# Patient Record
Sex: Male | Born: 1965 | Race: White | Hispanic: No | Marital: Married | Smoking: Never smoker
Health system: Southern US, Community
[De-identification: ages and names within clinical notes are randomized; demographics above are authoritative.]

## PROBLEM LIST (undated history)

## (undated) DIAGNOSIS — K219 Gastro-esophageal reflux disease without esophagitis: Secondary | ICD-10-CM

## (undated) HISTORY — PX: KNEE SURGERY: SHX244

---

## 2004-08-07 ENCOUNTER — Observation Stay: Payer: Self-pay | Admitting: General Surgery

## 2005-02-01 ENCOUNTER — Ambulatory Visit: Payer: Self-pay | Admitting: General Surgery

## 2006-11-04 IMAGING — CR DG CHEST 1V PORT
1 series · 1 of 1 positions shown · non-contrast
Comparison: none

REASON FOR EXAM: The patient showed evidence of pneumothorax on CT.
COMMENTS:

[view not recorded]
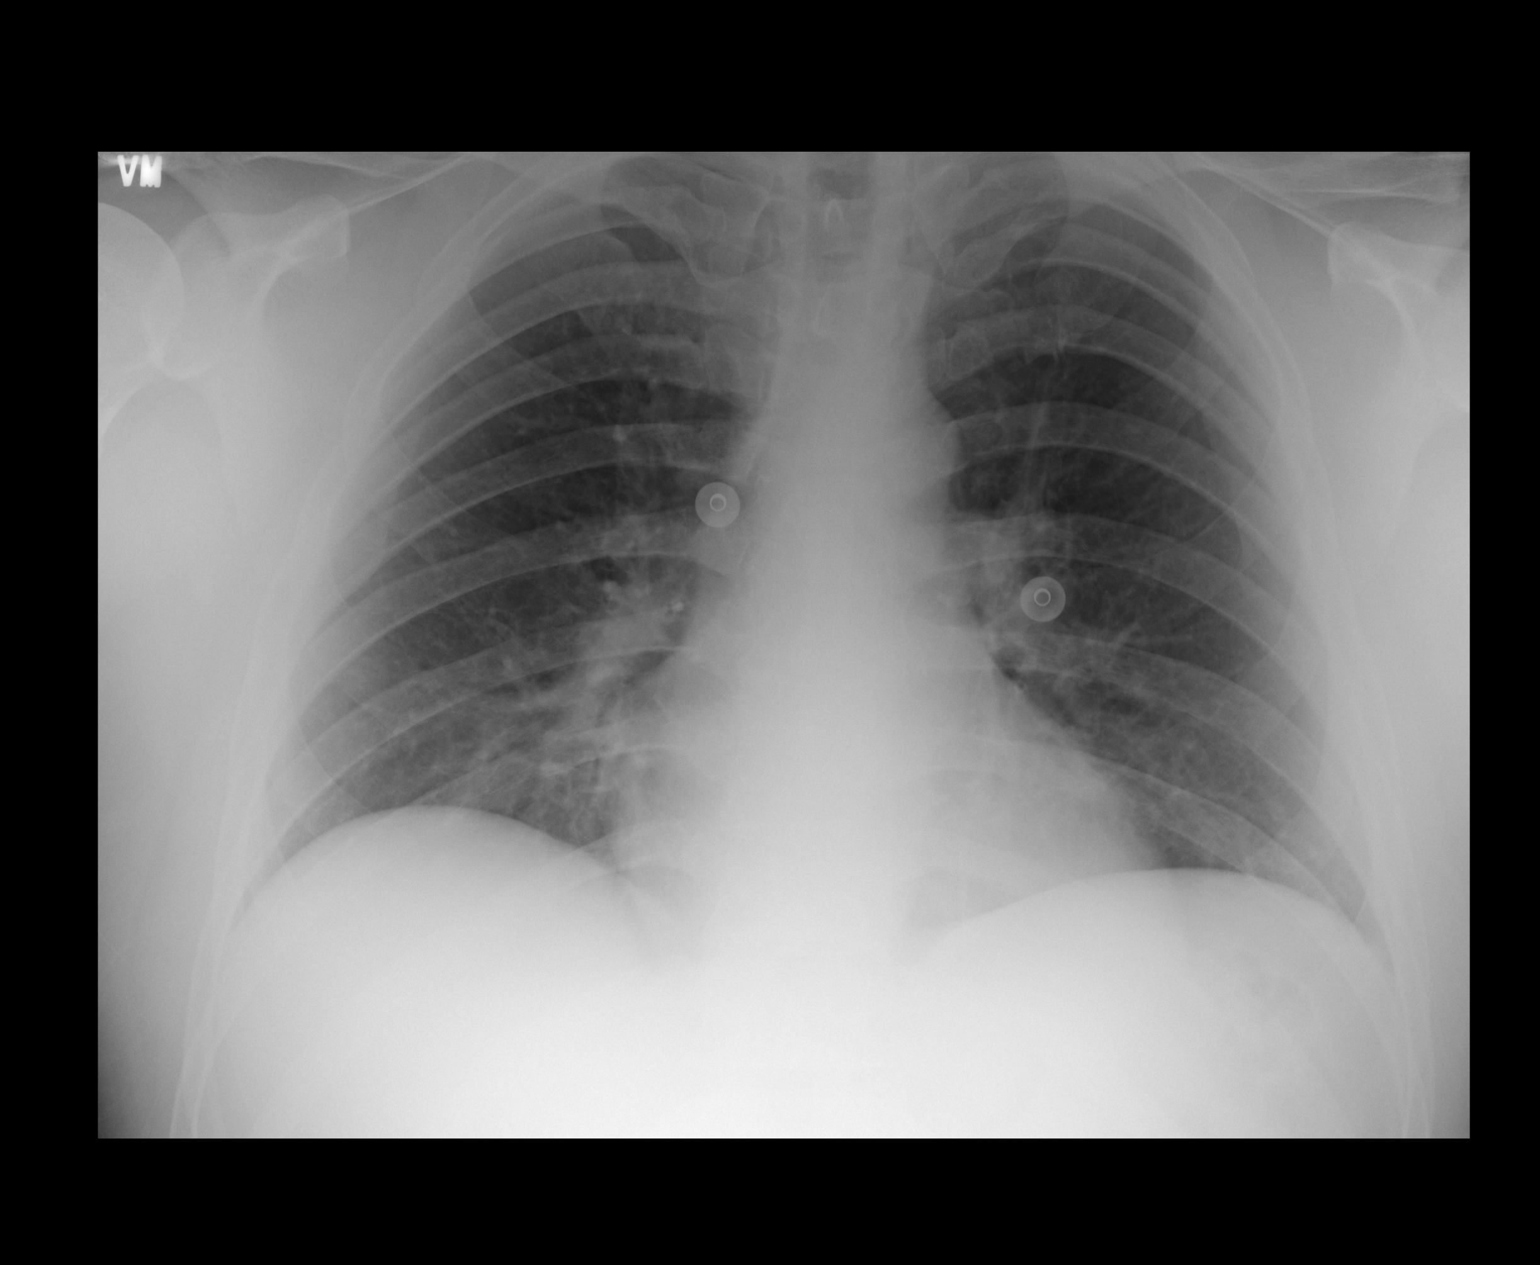

[1 of 1 positions shown; findings below may reference images not displayed]

PROCEDURE:     DXR - DXR PORTABLE CHEST SINGLE VIEW  - August 07, 2004  [DATE]

RESULT:     Portable chest does not show clear evidence of a pneumothorax.
The CT scan is a much more sensitive way to look for a pneumothorax.  The
chest x-ray does not show evidence of pneumothorax, mediastinal shift, or
rib fracture.
IMPRESSION: Unremarkable portable chest.  A pneumothorax could easily be missed that
would be seen on the CT.

No evidence of pneumonia or pleural effusion.

No mediastinal shift.

Follow-up study is recommended.

## 2008-08-30 ENCOUNTER — Ambulatory Visit: Payer: Self-pay | Admitting: Internal Medicine

## 2016-02-10 ENCOUNTER — Ambulatory Visit
Admission: EM | Admit: 2016-02-10 | Discharge: 2016-02-10 | Disposition: A | Discharge status: Home / Self Care | Payer: Worker's Compensation | Attending: Family Medicine | Admitting: Family Medicine

## 2016-02-10 ENCOUNTER — Ambulatory Visit (INDEPENDENT_AMBULATORY_CARE_PROVIDER_SITE_OTHER): Payer: Worker's Compensation

## 2016-02-10 DIAGNOSIS — S93401A Sprain of unspecified ligament of right ankle, initial encounter: Secondary | ICD-10-CM

## 2016-02-10 DIAGNOSIS — S82891A Other fracture of right lower leg, initial encounter for closed fracture: Secondary | ICD-10-CM

## 2016-02-10 HISTORY — DX: Gastro-esophageal reflux disease without esophagitis: K21.9

## 2016-02-10 MED ORDER — HYDROCODONE-ACETAMINOPHEN 5-325 MG PO TABS: 1.0000 | ORAL_TABLET | Freq: Four times a day (QID) | ORAL | 0 refills | Status: DC | PRN

## 2016-02-10 NOTE — Discharge Instructions (Signed)
Ice, elevation.

## 2016-02-10 NOTE — ED Provider Notes (Signed)
[facility]    CSN: [id number] Arrival date & time: 02/10/16  1429     History   Chief Complaint Chief Complaint  Patient presents with  . Ankle Pain    HPI Eric Berry is a 50 y.o. male.    50 yo male with a c/o right ankle pain and swelling after he fell backward off a ladder. States he fell approx 6 feet. Denies hitting his head or loss of consciousness.    The history is provided by the patient.    Past Medical History:  Diagnosis Date  . GERD (gastroesophageal reflux disease)     There are no active problems to display for this patient.   Past Surgical History:  Procedure Laterality Date  . KNEE SURGERY         Home Medications    Prior to Admission medications   Medication Sig Start Date End Date Taking? Authorizing Provider  omeprazole (PRILOSEC) 20 MG capsule Take 20 mg by mouth daily.   Yes Historical Provider, MD  HYDROcodone-acetaminophen (NORCO/VICODIN) 5-325 MG tablet Take 1-2 tablets by mouth every 6 (six) hours as needed. 02/10/16   Payton Mccallumrlando Catharine Kettlewell, MD    Family History History reviewed. No pertinent family history.  Social History Social History  Substance Use Topics  . Smoking status: Never Smoker  . Smokeless tobacco: Never Used  . Alcohol use Yes     Comment: social     Allergies   Review of patient's allergies indicates no known allergies.   Review of Systems Review of Systems   Physical Exam Triage Vital Signs ED Triage Vitals  Enc Vitals Group     BP 02/10/16 1519 117/72     Pulse Rate 02/10/16 1519 86     Resp 02/10/16 1519 20     Temp 02/10/16 1519 98.3 F (36.8 C)     Temp Source 02/10/16 1519 Oral     SpO2 02/10/16 1519 97 %     Weight 02/10/16 1519 235 lb (106.6 kg)     Height 02/10/16 1519 5\' 10"  (1.778 m)     Head Circumference --      Peak Flow --      Pain Score 02/10/16 1524 5     Pain Loc --      Pain Edu? --      Excl. in GC? --    No data found.   Updated Vital Signs BP  117/72 (BP Location: Left Arm)   Pulse 86   Temp 98.3 F (36.8 C) (Oral)   Resp 20   Ht 5\' 10"  (1.778 m)   Wt 235 lb (106.6 kg)   SpO2 97%   BMI 33.72 kg/m   Visual Acuity Right Eye Distance:   Left Eye Distance:   Bilateral Distance:    Right Eye Near:   Left Eye Near:    Bilateral Near:     Physical Exam  Constitutional: He appears well-developed and well-nourished. No distress.  Musculoskeletal:       Right ankle: He exhibits decreased range of motion, swelling and ecchymosis. He exhibits no deformity, no laceration and normal pulse. Tenderness. Lateral malleolus and AITFL tenderness found. Achilles tendon normal.  Skin: He is not diaphoretic.  Nursing note and vitals reviewed.    UC Treatments / Results  Labs (all labs ordered are listed, but only abnormal results are displayed) Labs Reviewed - No data to display  EKG  EKG Interpretation None  Radiology No results found.  Procedures Procedures (including critical care time)  Medications Ordered in UC Medications - No data to display   Initial Impression / Assessment and Plan / UC Course  I have reviewed the triage vital signs and the nursing notes.  Pertinent labs & imaging results that were available during my care of the patient were reviewed by me and considered in my medical decision making (see chart for details).  Clinical Course     Final Clinical Impressions(s) / UC Diagnoses   Final diagnoses:  Sprain of right ankle, unspecified ligament, initial encounter  Closed avulsion fracture of right ankle, initial encounter    New Prescriptions Discharge Medication List as of 02/10/2016  4:19 PM    START taking these medications   Details  HYDROcodone-acetaminophen (NORCO/VICODIN) 5-325 MG tablet Take 1-2 tablets by mouth every 6 (six) hours as needed., Starting Fri 02/10/2016, Print       1. x-ray results and diagnosis reviewed with patient 2. rx as per orders above; reviewed  possible side effects, interactions, risks and benefits  3. Immobilization with boot  4. Follow-up in 1 week at [facility] for recheck   Payton Mccallum, MD 02/17/16 1118

## 2016-02-10 NOTE — ED Triage Notes (Addendum)
Worker's Comp. Pt reports he was on a ladder working on a roof when the ladder fell backward and he fell approx 6 feet. Right ankle pain and swelling 5/10. Abrasion to left forearm

## 2018-05-09 IMAGING — CR DG ANKLE COMPLETE 3+V*R*
3 series · 3 of 3 positions shown · non-contrast
Comparison: None.

CLINICAL DATA: Pain and swelling after falling from ladder earlier
today

EXAM:
RIGHT ANKLE - COMPLETE 3+ VIEW

[ankle ap]
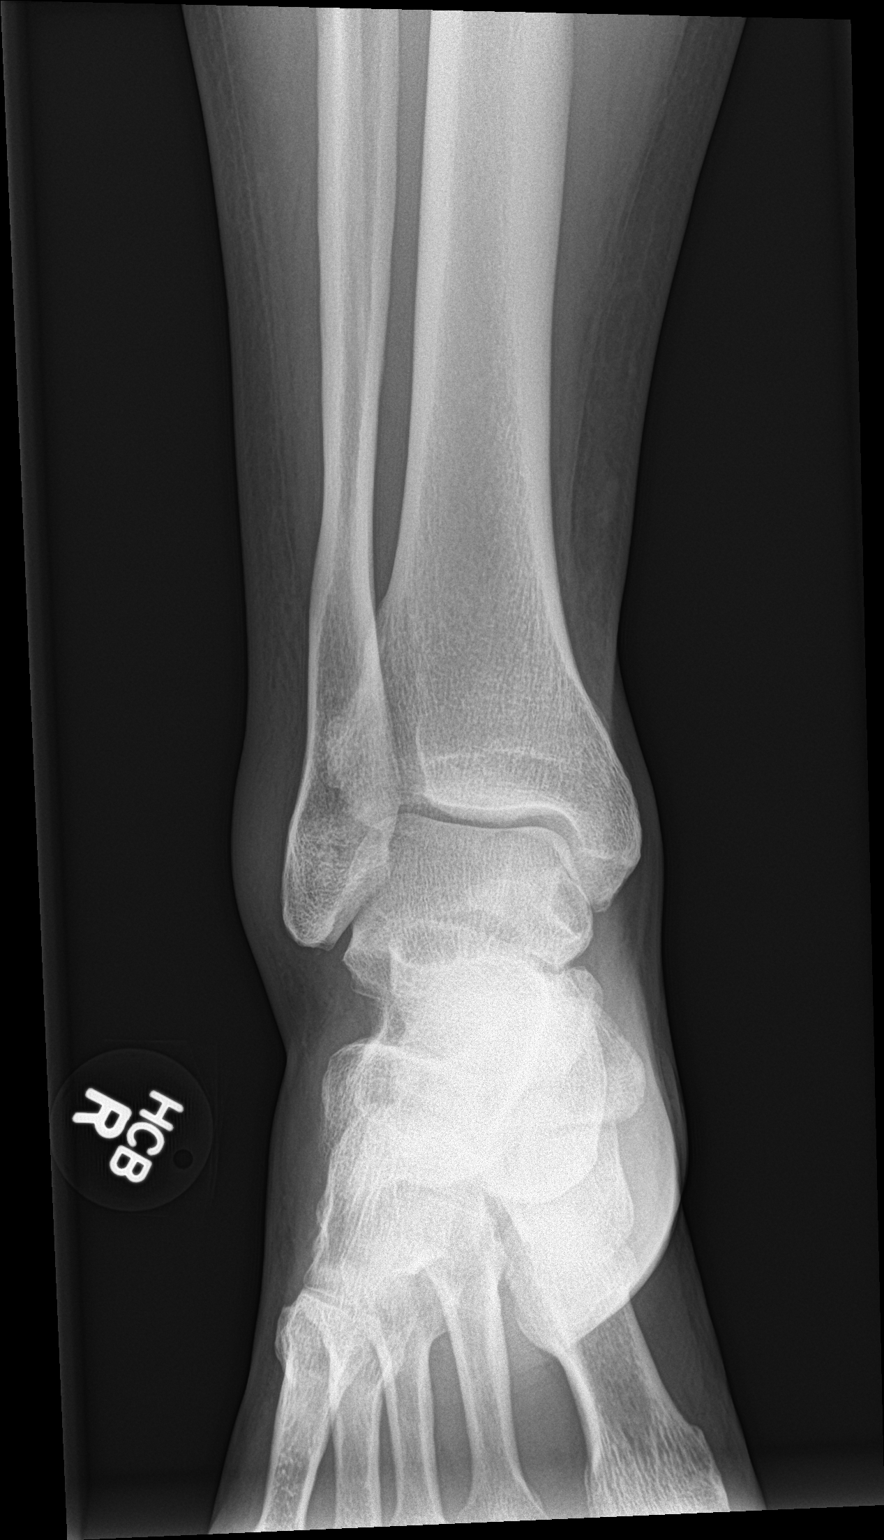

[ankle obl]
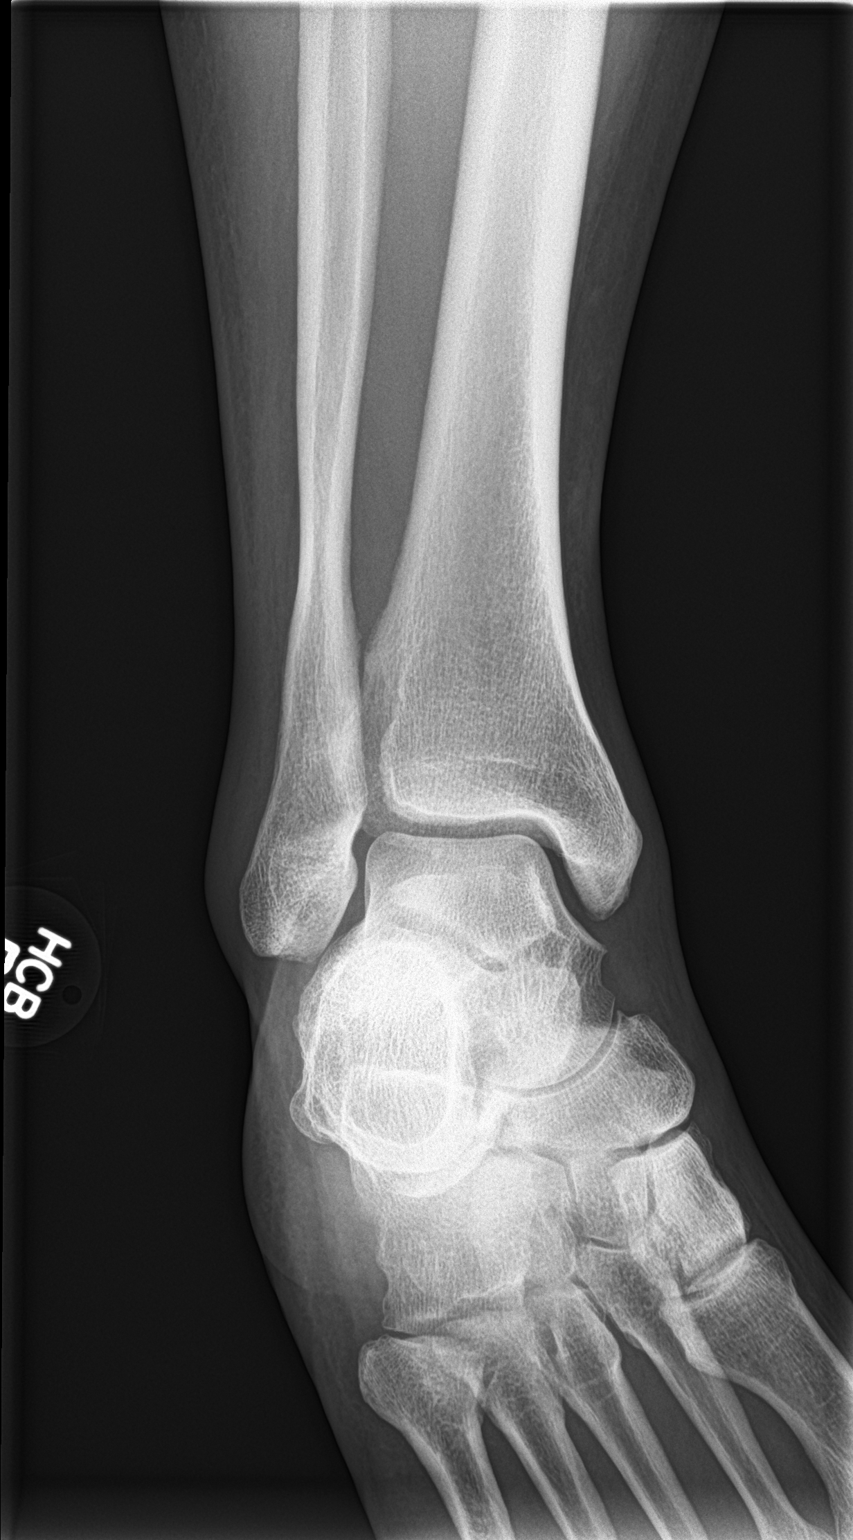

[ankle lat]
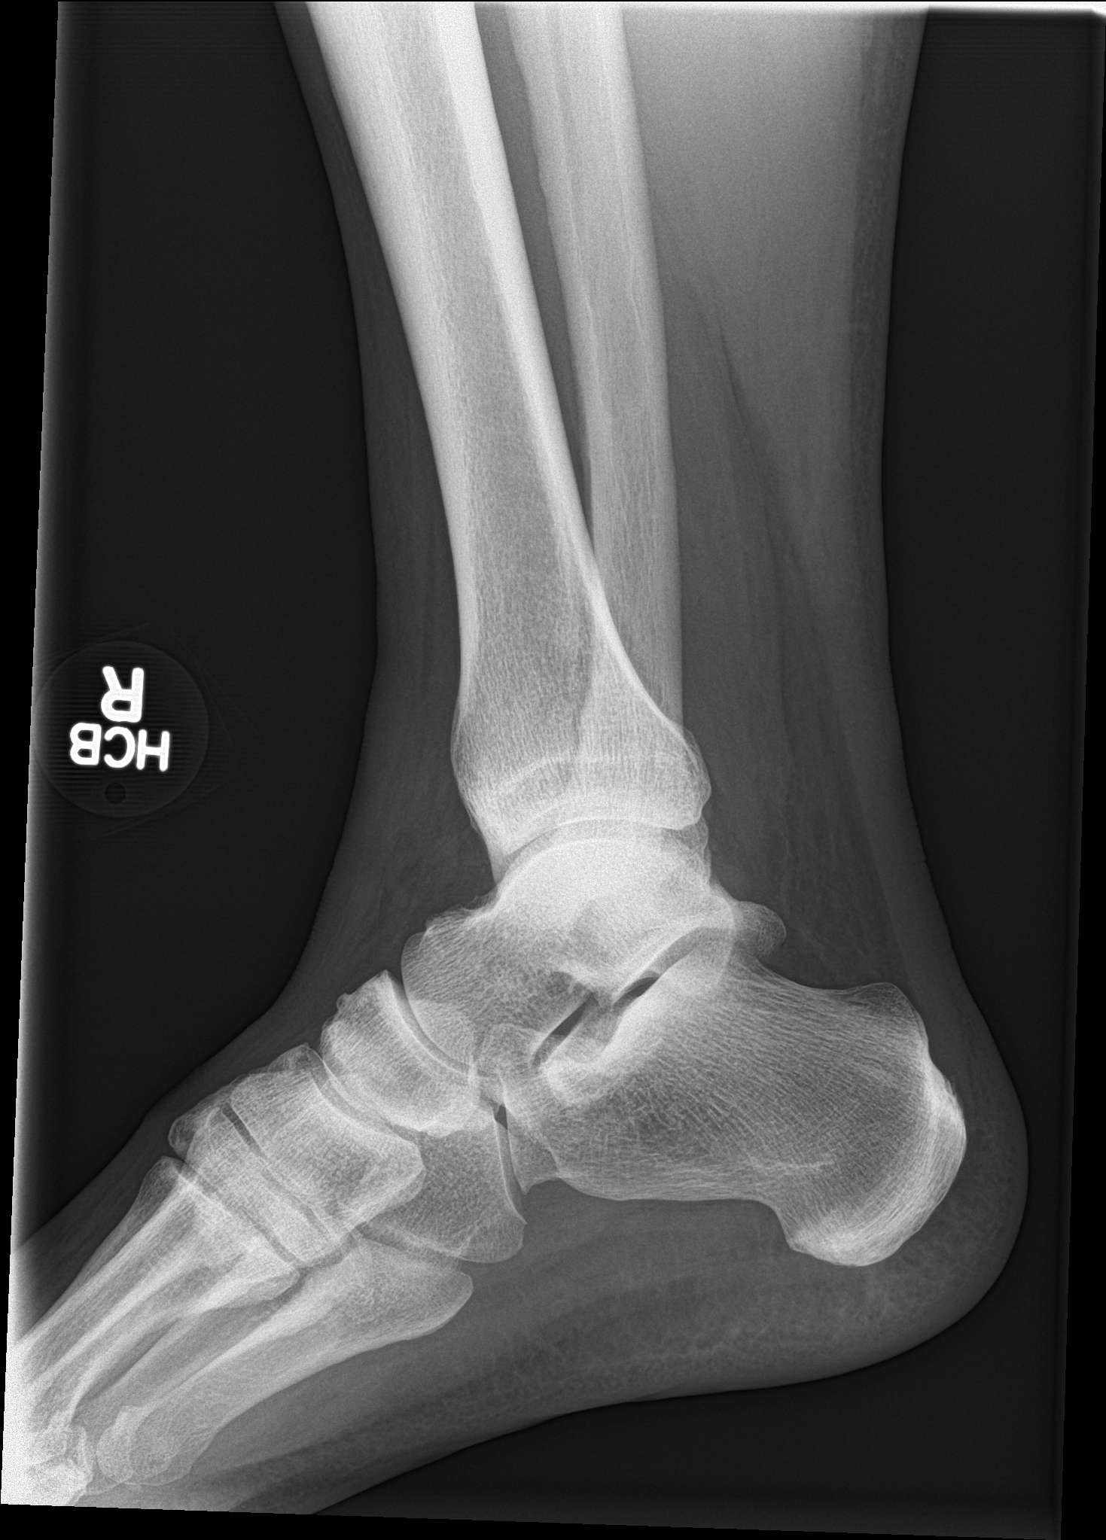

[3 of 3 positions shown; findings below may reference images not displayed]

FINDINGS: Frontal, oblique, and lateral views were obtained. There is soft
tissue swelling laterally. There is a tiny avulsion arising the
lateral malleolus. There is no other evidence of fracture. No joint
effusion. Ankle mortise appears intact. No appreciable joint space
narrowing.
IMPRESSION: Tiny avulsion arising the lateral malleolus with soft tissue
swelling laterally. No other evidence of fracture. Ankle mortise
appears intact. No appreciable joint space narrowing.

These results will be called to the ordering clinician or
representative by the Radiologist Assistant, and communication
documented in the PACS or zVision Dashboard.

## 2018-09-30 ENCOUNTER — Other Ambulatory Visit: Payer: Self-pay

## 2018-09-30 ENCOUNTER — Ambulatory Visit
Admission: EM | Admit: 2018-09-30 | Discharge: 2018-09-30 | Disposition: A | Discharge status: Home / Self Care | Payer: Self-pay | Attending: Urgent Care | Admitting: Urgent Care

## 2018-09-30 DIAGNOSIS — L02419 Cutaneous abscess of limb, unspecified: Secondary | ICD-10-CM

## 2018-09-30 DIAGNOSIS — L03119 Cellulitis of unspecified part of limb: Secondary | ICD-10-CM

## 2018-09-30 MED ORDER — SULFAMETHOXAZOLE-TRIMETHOPRIM 800-160 MG PO TABS: 1.0000 | ORAL_TABLET | Freq: Two times a day (BID) | ORAL | 0 refills | Status: AC

## 2018-09-30 NOTE — ED Triage Notes (Signed)
Patient complains of area on his knee that has been going on since Thursday. Patient states that he had an area come up on his knee that he thought was an ingrown hair. States that the 1st night he noticed this he had a fever.

## 2018-09-30 NOTE — ED Provider Notes (Signed)
[address] [place], [place] [address] [phone]   Name: Eric Berry DOB: May 15, 1965 MRN: [id number] CSN: [id number] PCP: Patient, No Pcp Per  Arrival date and time:  09/30/18 1751  Chief Complaint:  Cellulitis  NOTE: Prior to seeing the patient today, I have reviewed the triage nursing documentation and vital signs. Clinical staff has updated patient's PMH/PSHx, current medication list, and drug allergies/intolerances to ensure comprehensive history available to assist in medical decision making.   History:   HPI: Eric Berry is a 53 y.o. male who presents today with complaints of pain and swelling to his RIGHT knee since Thursday (09/25/2018).  Patient notes that he had an area of redness and tender swelling declare on Thursday that he initially thought was a "ingrown hair".  Patient experienced a fever with a T-max of 102.5 on Thursday; resolved with a single dose of APAP.  Patient notes that on Friday (09/26/2018), redness began to extend up his inner thigh.  Patient developed a single swollen lymph node in his RIGHT groin.  Patient advising that he "played with the bump on his knee" in efforts to "clean it out".  (+) purulent drainage (small amount) able to be expressed from the abscess.  Patient presents today with a noted improvement of the aforementioned symptoms, however abscess remains in place.  There is minimal lymphangitic spread noted to RIGHT thigh.  No recurrent fevers.  Abscess is not draining.  Area is minimally warm to touch.  Past Medical History:  Diagnosis Date  . GERD (gastroesophageal reflux disease)     Past Surgical History:  Procedure Laterality Date  . KNEE SURGERY      Family History  Problem Relation Age of Onset  . Heart disease Father     Social History   Socioeconomic History  . Marital status: Married    Spouse name: Not on file  . Number of children: Not on file  . Years of education: Not on file  . Highest  education level: Not on file  Occupational History  . Not on file  Social Needs  . Financial resource strain: Not on file  . Food insecurity:    Worry: Not on file    Inability: Not on file  . Transportation needs:    Medical: Not on file    Non-medical: Not on file  Tobacco Use  . Smoking status: Never Smoker  . Smokeless tobacco: Never Used  Substance and Sexual Activity  . Alcohol use: Yes    Comment: social  . Drug use: No  . Sexual activity: Not on file  Lifestyle  . Physical activity:    Days per week: Not on file    Minutes per session: Not on file  . Stress: Not on file  Relationships  . Social connections:    Talks on phone: Not on file    Gets together: Not on file    Attends religious service: Not on file    Active member of club or organization: Not on file    Attends meetings of clubs or organizations: Not on file    Relationship status: Not on file  . Intimate partner violence:    Fear of current or ex partner: Not on file    Emotionally abused: Not on file    Physically abused: Not on file    Forced sexual activity: Not on file  Other Topics Concern  . Not on file  Social History Narrative  . Not on file  There are no active problems to display for this patient.   Home Medications:    Current Meds  Medication Sig  . omeprazole (PRILOSEC) 20 MG capsule Take 20 mg by mouth daily.    Allergies:   Patient has no known allergies.  Review of Systems (ROS): Review of Systems  Constitutional: Positive for fever (Tmax 102.5 x 1 day; resolved with APAP x 1 dose). Negative for chills.  Respiratory: Negative for cough and shortness of breath.   Cardiovascular: Negative for chest pain and palpitations.  Musculoskeletal:       Abscess to RIGHT knee  Skin: Positive for color change and wound.  Hematological: Positive for adenopathy.     Physical Exam:  Triage Vital Signs ED Triage Vitals  Enc Vitals Group     BP 09/30/18 1800 126/86     Pulse  Rate 09/30/18 1800 86     Resp 09/30/18 1800 16     Temp 09/30/18 1800 98.4 F (36.9 C)     Temp Source 09/30/18 1800 Oral     SpO2 09/30/18 1800 97 %     Weight 09/30/18 1759 250 lb (113.4 kg)     Height 09/30/18 1759 5\' 10"  (1.778 m)     Head Circumference --      Peak Flow --      Pain Score 09/30/18 1758 3     Pain Loc --      Pain Edu? --      Excl. in GC? --     Physical Exam  Constitutional: He is oriented to person, place, and time and well-developed, well-nourished, and in no distress.  HENT:  Head: Normocephalic and atraumatic.  Mouth/Throat: Oropharynx is clear and moist and mucous membranes are normal.  Cardiovascular: Normal rate, regular rhythm, normal heart sounds and intact distal pulses. Exam reveals no gallop and no friction rub.  No murmur heard. Pulmonary/Chest: Effort normal and breath sounds normal. No respiratory distress. He has no wheezes. He has no rales.  Lymphadenopathy:       Right: No inguinal adenopathy present.  Neurological: He is alert and oriented to person, place, and time.  Skin: Skin is warm and dry. Lesion (2.0 cm abscess to RIGHT medial knee; centralized area of scabbing with small amount of noted purulent drainage; minimal fluctuance) noted. No rash noted. There is erythema (abscess with (+) lympangitis spread into RIGHT innter thigh).     Psychiatric: Mood, affect and judgment normal.  Nursing note and vitals reviewed.    Urgent Care Treatments / Results:   LABS: PLEASE NOTE: all labs that were ordered this encounter are listed, however only abnormal results are displayed. Labs Reviewed - No data to display  EKG: -None  RADIOLOGY: No results found.  PRODEDURES: Procedures  MEDICATIONS RECEIVED THIS VISIT: Medications - No data to display  PERTINENT CLINICAL COURSE NOTES/UPDATES:   Initial Impression / Assessment and Plan / Urgent Care Course:    Eric Berry is a 53 y.o. male who presents to [facility] today  with complaints of Cellulitis  Pertinent labs & imaging results that were available during my care of the patient were personally reviewed by me and considered in my medical decision making (see lab/imaging section of note for values and interpretations).  Exam reveals 2 cm abscess to RIGHT medial knee that is been in place since last Thursday (09/25/2018).  Patient has unroofed abscess on several occasions and has been able to express purulent material. (+) fever x1  day; T-max 102.5. (+) lymphangitic spread into RIGHT thigh area. Previously tender LAD to RIGHT groin has resolved. Area open and draining.  There is minimal fluctuance.  No immediate need for I&D at this time.  Will prescribe patient a 5-day course of SMZ-TMP.  Patient encouraged to continue warm compresses. May use Tylenol and/or Ibuprofen as needed for pain/fever.   Discussed follow up with primary care physician in 1 week for re-evaluation. I have reviewed the follow up and strict return precautions for any new or worsening symptoms. Patient is aware of symptoms that would be deemed urgent/emergent, and would thus require further evaluation either here or in the emergency department. At the time of discharge, he verbalized understanding and consent with the discharge plan as it was reviewed with him. All questions were fielded by provider and/or clinic staff prior to patient discharge.    Final Clinical Impressions(s) / Urgent Care Diagnoses:   Final diagnoses:  Cellulitis and abscess of leg    New Prescriptions:   Meds ordered this encounter  Medications  . sulfamethoxazole-trimethoprim (BACTRIM DS) 800-160 MG tablet    Sig: Take 1 tablet by mouth 2 (two) times daily for 5 days.    Dispense:  10 tablet    Refill:  0    Controlled Substance Prescriptions:  [place] Controlled Substance Registry consulted? Not Applicable  NOTE: This note was prepared using Dragon dictation software along with smaller phrase technology. Despite my  best ability to proofread, there is the potential that transcriptional errors may still occur from this process, and are completely unintentional.     Verlee MonteGray, Kalii Chesmore E, NP 09/30/18 1845

## 2018-09-30 NOTE — Discharge Instructions (Addendum)
It was very nice meeting you today in clinic. Thank you for entrusting me with your care.   As discussed, you have a small abscess with some associated cellulitis. Will cover with antibiotics. Please utilize the medications that we discussed. Your prescriptions have been called in to your pharmacy.   Continue to use warm compresses 3 times a day for 15 minutes at a time to help with pain and swelling. May use Tylenol and/or Ibuprofen as needed for pain/fever.   Make arrangements to follow up with your regular doctor in 1 week for re-evaluation. If your symptoms/condition worsens, please seek follow up care either here or in the ER. Please remember, our [facility] providers are "right here with you" when you need [place].   Again, it was my pleasure to take care of you today. Thank you for choosing our clinic. I hope that you start to feel better quickly.   Quentin Mulling, MSN, APRN, FNP-C, CEN Advanced Practice Provider [facility]
